# Patient Record
Sex: Female | Born: 1995 | Race: White | Hispanic: No | Marital: Married | Smoking: Never smoker
Health system: Southern US, Community
[De-identification: ages and names within clinical notes are randomized; demographics above are authoritative.]

## PROBLEM LIST (undated history)

## (undated) DIAGNOSIS — F32A Depression, unspecified: Secondary | ICD-10-CM

## (undated) DIAGNOSIS — F509 Eating disorder, unspecified: Secondary | ICD-10-CM

## (undated) DIAGNOSIS — F419 Anxiety disorder, unspecified: Secondary | ICD-10-CM

## (undated) HISTORY — PX: NO PAST SURGERIES: SHX2092

---

## 2019-09-23 ENCOUNTER — Other Ambulatory Visit: Payer: Self-pay

## 2019-09-23 ENCOUNTER — Encounter (HOSPITAL_COMMUNITY): Payer: Self-pay

## 2019-09-23 ENCOUNTER — Emergency Department (HOSPITAL_COMMUNITY)
Admission: EM | Admit: 2019-09-23 | Discharge: 2019-09-23 | Disposition: A | Discharge status: Home / Self Care | Payer: 59 | Attending: Emergency Medicine | Admitting: Emergency Medicine

## 2019-09-23 ENCOUNTER — Emergency Department (HOSPITAL_COMMUNITY): Payer: 59

## 2019-09-23 DIAGNOSIS — M79631 Pain in right forearm: Secondary | ICD-10-CM | POA: Diagnosis not present

## 2019-09-23 DIAGNOSIS — M25531 Pain in right wrist: Secondary | ICD-10-CM | POA: Diagnosis not present

## 2019-09-23 DIAGNOSIS — M25521 Pain in right elbow: Secondary | ICD-10-CM | POA: Diagnosis present

## 2019-09-23 DIAGNOSIS — W19XXXA Unspecified fall, initial encounter: Secondary | ICD-10-CM

## 2019-09-23 DIAGNOSIS — M545 Low back pain: Secondary | ICD-10-CM | POA: Diagnosis not present

## 2019-09-23 LAB — I-STAT BETA HCG BLOOD, ED (MC, WL, AP ONLY): I-stat hCG, quantitative: <5 m[IU]/mL (ref <5)

## 2019-09-23 MED ORDER — HYDROCODONE-ACETAMINOPHEN 5-325 MG PO TABS
1.0000 | ORAL_TABLET | Freq: Once | ORAL | Status: AC
  Administered 2019-09-23: 1 via ORAL
  Filled 2019-09-23: qty 1

## 2019-09-23 MED ORDER — IBUPROFEN 800 MG PO TABS: 800.0000 mg | ORAL_TABLET | Freq: Three times a day (TID) | ORAL | 0 refills | Status: DC | PRN

## 2019-09-23 MED ORDER — CYCLOBENZAPRINE HCL 10 MG PO TABS: 10.0000 mg | ORAL_TABLET | Freq: Every evening | ORAL | 0 refills | Status: DC | PRN

## 2019-09-23 NOTE — ED Triage Notes (Signed)
Pt presents with c/o lower back pain and right arm pain following a fall down 6-7 stairs. Pt states the fall was mechanical. Pt denies hitting her head/LOC. Pt A+Ox4, NAD.

## 2019-09-23 NOTE — Progress Notes (Signed)
Orthopedic Tech Progress Note Patient Details:  Heather Kent 07-11-1996 [id number]  Ortho Devices Type of Ortho Device: Arm sling Ortho Device/Splint Location: RUE Ortho Device/Splint Interventions: Ordered, Application   Post Interventions Patient Tolerated: Well Instructions Provided: Care of device   Braulio Bosch 09/23/2019, 9:23 PM

## 2019-09-23 NOTE — ED Provider Notes (Signed)
[facility] EMERGENCY DEPARTMENT Provider Note   CSN: [id number] Arrival date & time: 09/23/19  1634     History   Chief Complaint Chief Complaint  Patient presents with  . Fall  . Back Pain  . Arm Pain    HPI Heather Kent is a 23 y.o. female with no significant past medical history presents to the ED after sustaining mechanical fall down three steps of stairs.  She reports that she slipped and leaned backward, sliding down the stairs.  She reports pain and discomfort to her right elbow, forearm, wrist as well as lumbar spine.  She was able to ambulate without difficulty, but endorses pain in the low back.  She denies any head trauma, memory disturbance, nausea or vomiting, headache or dizziness, chest pain or difficulty breathing, visual changes, numbness, tingling, or other neurologic deficits.       HPI  History reviewed. No pertinent past medical history.  There are no active problems to display for this patient.   History reviewed. No pertinent surgical history.   OB History   No obstetric history on file.      Home Medications    Prior to Admission medications   Medication Sig Start Date End Date Taking? Authorizing Provider  cyclobenzaprine (FLEXERIL) 10 MG tablet Take 1 tablet (10 mg total) by mouth at bedtime as needed for muscle spasms. 09/23/19   Corena Herter, PA-C  ibuprofen (ADVIL) 800 MG tablet Take 1 tablet (800 mg total) by mouth 3 (three) times daily as needed for moderate pain. 09/23/19   Corena Herter, PA-C    Family History No family history on file.  Social History Social History   Tobacco Use  . Smoking status: Not on file  Substance Use Topics  . Alcohol use: Not on file  . Drug use: Not on file     Allergies   Patient has no known allergies.   Review of Systems Review of Systems  Constitutional: Negative for fever.  Respiratory: Negative for shortness of breath.   Cardiovascular: Negative for chest  pain.  Gastrointestinal: Negative for abdominal pain.  Neurological: Negative for dizziness and numbness.     Physical Exam Updated Vital Signs BP 114/65 (BP Location: Left Arm)   Pulse 77   Temp 98.6 F (37 C) (Oral)   Resp 18   LMP 08/28/2019 (LMP Unknown)   SpO2 99%   Physical Exam Vitals signs and nursing note reviewed. Exam conducted with a chaperone present.  Constitutional:      Appearance: Normal appearance.  HENT:     Head: Normocephalic and atraumatic.     Comments: No evidence of raccoon eyes, battle sign, hemotympanum, or other evidence of basilar skull fracture.  No palpable skull defect or overlying skin changes.    Mouth/Throat:     Pharynx: Oropharynx is clear.  Eyes:     General: No scleral icterus.    Conjunctiva/sclera: Conjunctivae normal.  Neck:     Musculoskeletal: Normal range of motion and neck supple. No neck rigidity or muscular tenderness.  Cardiovascular:     Rate and Rhythm: Normal rate and regular rhythm.     Pulses: Normal pulses.     Heart sounds: Normal heart sounds.  Pulmonary:     Effort: Pulmonary effort is normal. No respiratory distress.     Breath sounds: Normal breath sounds.  Abdominal:     General: Abdomen is flat. There is no distension.     Palpations: Abdomen is  soft.     Tenderness: There is no abdominal tenderness. There is no guarding.  Skin:    General: Skin is dry.  Neurological:     Mental Status: She is alert.     GCS: GCS eye subscore is 4. GCS verbal subscore is 5. GCS motor subscore is 6.  Psychiatric:        Mood and Affect: Mood normal.        Behavior: Behavior normal.        Thought Content: Thought content normal.      ED Treatments / Results  Labs (all labs ordered are listed, but only abnormal results are displayed) Labs Reviewed  I-STAT BETA HCG BLOOD, ED ([facility], [facility], [facility] ONLY)    EKG None  Radiology Dg Lumbar Spine Complete  Result Date: 09/23/2019 CLINICAL DATA:  Low back pain, fall EXAM:  LUMBAR SPINE - COMPLETE 4+ VIEW COMPARISON:  None. FINDINGS: There is no evidence of lumbar spine fracture. Alignment is normal. Intervertebral disc spaces are maintained. IMPRESSION: Negative. Electronically Signed   By: Charlett NoseKevin  Dover M.D.   On: 09/23/2019 20:22   Dg Forearm Right  Result Date: 09/23/2019 CLINICAL DATA:  Fall down steps.  Right arm pain EXAM: RIGHT FOREARM - 2 VIEW COMPARISON:  None. FINDINGS: There is no evidence of fracture or other focal bone lesions. Soft tissues are unremarkable. IMPRESSION: Negative. Electronically Signed   By: Charlett NoseKevin  Dover M.D.   On: 09/23/2019 20:22    Procedures Procedures (including critical care time)  Medications Ordered in ED Medications  HYDROcodone-acetaminophen (NORCO/VICODIN) 5-325 MG per tablet 1 tablet (1 tablet Oral Given 09/23/19 2043)     Initial Impression / Assessment and Plan / ED Course  I have reviewed the triage vital signs and the nursing notes.  Pertinent labs & imaging results that were available during my care of the patient were reviewed by me and considered in my medical decision making (see chart for details).        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  Patient is able to ambulate without difficulty, albeit with some discomfort.  No red flags concerning patient's back pain. No s/s of central cord compression or cauda equina. Lower extremities are neurovascularly intact and patient is ambulating without difficulty.  DG lumbar spine and DG forearm were interpreted and demonstrate no dislocation, subluxation, fracture, or other bony other abnormalities.  Patient's low back discomfort is predominantly on her right side where she claims she hit the steps.  No radicular symptoms at this time.  No saddle anesthesia or incontinence.  Her arm demonstrates full range of motion and is neurovascularly intact.  Radial, median, and ulnar nerves  were assessed with no abnormal findings.  Will provide sling for support and relief of her discomfort.  We will also prescribe a short course of muscle relaxers and refer her to orthopedics should she continue to experience any pain discomfort.  Also will encourage her to continue taking ibuprofen 800 mg 3 times daily as needed for pain and discomfort.   All of the evaluation and work-up results were discussed with the patient and any family at bedside. They were provided opportunity to ask any additional questions and have none at this time. They have expressed understanding of verbal discharge instructions as well as return precautions and are agreeable to the plan.     Final Clinical Impressions(s) / ED Diagnoses  Final diagnoses:  Fall, initial encounter    ED Discharge Orders         Ordered    cyclobenzaprine (FLEXERIL) 10 MG tablet  At bedtime PRN     09/23/19 2128    ibuprofen (ADVIL) 800 MG tablet  3 times daily PRN     09/23/19 2128           Elvera Maria 09/23/19 2128    Sabas Sous, MD 09/24/19 2303

## 2019-09-23 NOTE — ED Notes (Signed)
Patient verbalizes understanding of discharge instructions. Opportunity for questioning and answers were provided. Armband removed by staff, pt discharged from ED.  

## 2019-09-23 NOTE — Discharge Instructions (Signed)
You were given a prescription for Flexeril which is a muscle relaxer.  You should not drive, work, consume alcohol, or operate machinery while taking this medication as it can make you very drowsy.     Your back pain should be treated with medicines such as ibuprofen or aleve and this back pain should improve over the next 2 weeks.  However if you develop severe or worsening pain, low back pain with fever, numbness, weakness, or inability to walk or urinate, you should return to the ER immediately. You will need to follow up with your primary healthcare provider in 1-2 weeks for reassessment.  Low back pain is discomfort in the lower back that may be due to injuries to muscles and ligaments around the spine. Occasionally, it may be caused by a problem to a part of the spine called a disc.  The pain may last several days or a week, however most patients' pain is completely resolved in 4 weeks.  Self-care:  The application of heat can help soothe the pain.  Maintaining your daily activities, including walking, is encourged, as it will help you get better faster than staying in bed.  Medications are also useful to help with pain control.  A commonly prescribed medications includes acetaminophen.  This medication is generally safe, though you should not take more than 8 of the extra strength (500mg ) pills a day.  Non steroidal anti inflammatory medications including Ibuprofen and naproxen;  These medications help both pain and swelling and are very useful in treating back pain.  They should be taken with food, as they can cause stomach upset, and more seriously, stomach bleeding.    Muscle relaxants:  These medications can help with muscle tightness that is a cause of lower back pain.  Most of these medications can cause drowsiness, and it is not safe to drive or use dangerous machinery while taking them.  Be aware that if you develop new symptoms, such as a fever, leg weakness, difficulty with or loss of  control of your urine or bowels, abdominal pain, or more severe pain, you will need to seek medical attention and  / or return to the Emergency department.  If you do not have a doctor see the list below.

## 2020-04-22 LAB — OB RESULTS CONSOLE ABO/RH: RH Type: POSITIVE

## 2020-04-22 LAB — OB RESULTS CONSOLE VARICELLA ZOSTER ANTIBODY, IGG: Varicella: NON-IMMUNE/NOT IMMUNE

## 2020-04-22 LAB — OB RESULTS CONSOLE RPR: RPR: NONREACTIVE

## 2020-04-22 LAB — OB RESULTS CONSOLE HEPATITIS B SURFACE ANTIGEN: Hepatitis B Surface Ag: NEGATIVE

## 2020-04-22 LAB — OB RESULTS CONSOLE HIV ANTIBODY (ROUTINE TESTING): HIV: NONREACTIVE

## 2020-04-22 LAB — OB RESULTS CONSOLE GC/CHLAMYDIA
Chlamydia: NEGATIVE
Gonorrhea: NEGATIVE

## 2020-04-22 LAB — OB RESULTS CONSOLE RUBELLA ANTIBODY, IGM: Rubella: NON-IMMUNE/NOT IMMUNE

## 2020-10-21 LAB — OB RESULTS CONSOLE GBS: GBS: NEGATIVE

## 2020-10-29 ENCOUNTER — Telehealth (HOSPITAL_COMMUNITY): Payer: Self-pay | Admitting: *Deleted

## 2020-10-29 ENCOUNTER — Encounter (HOSPITAL_COMMUNITY): Payer: Self-pay | Admitting: *Deleted

## 2020-10-29 NOTE — Telephone Encounter (Signed)
Preadmission screen  

## 2020-10-30 ENCOUNTER — Telehealth (HOSPITAL_COMMUNITY): Payer: Self-pay | Admitting: *Deleted

## 2020-10-30 NOTE — Telephone Encounter (Signed)
Preadmission screen  

## 2020-11-03 ENCOUNTER — Telehealth (HOSPITAL_COMMUNITY): Payer: Self-pay | Admitting: *Deleted

## 2020-11-03 ENCOUNTER — Encounter (HOSPITAL_COMMUNITY): Payer: Self-pay | Admitting: *Deleted

## 2020-11-03 NOTE — Telephone Encounter (Signed)
Preadmission screen  

## 2020-11-08 NOTE — L&D Delivery Note (Signed)
DELIVERY NOTE  Pt complete and at +2 station with urge to push. Epidural controlling pain. Pt pushed and delivered a viable female infant in LOA position. Nuchal x2, removed. Anterior and posterior shoulders spontaneously delivered with next two pushes; body easily followed next. Infant placed on mothers abdomen and bulb suction of mouth and nose performed. Cord was then clamped and cut by FOB. Cord blood obtained, 3VC. Baby had a vigorous spontaneous cry noted. Placenta then delivered at 1631 intact. Fundal massage performed and pitocin per protocol. Fundus firm. The following lacerations were noted: BL labial and sulcal. Repaired in routine fashion with 3-o monocryl and 2-0 vicryl, respectively Mother and baby stable. Counts correct. EBL 400cc  Infant time: 65 Gender: female Placenta time: 1631 Apgars: 8/9 Weight: pending skin-to-skin

## 2020-11-10 ENCOUNTER — Other Ambulatory Visit: Payer: Self-pay | Admitting: Obstetrics and Gynecology

## 2020-11-10 ENCOUNTER — Other Ambulatory Visit (HOSPITAL_COMMUNITY)
Admission: RE | Admit: 2020-11-10 | Discharge: 2020-11-10 | Disposition: A | Discharge status: Home / Self Care | Payer: PRIVATE HEALTH INSURANCE | Source: Ambulatory Visit | Attending: Obstetrics and Gynecology | Admitting: Obstetrics and Gynecology

## 2020-11-10 DIAGNOSIS — Z20822 Contact with and (suspected) exposure to covid-19: Secondary | ICD-10-CM | POA: Insufficient documentation

## 2020-11-10 DIAGNOSIS — Z01818 Encounter for other preprocedural examination: Secondary | ICD-10-CM | POA: Insufficient documentation

## 2020-11-11 ENCOUNTER — Other Ambulatory Visit: Payer: Self-pay

## 2020-11-11 ENCOUNTER — Inpatient Hospital Stay (HOSPITAL_COMMUNITY): Payer: PRIVATE HEALTH INSURANCE | Admitting: Anesthesiology

## 2020-11-11 ENCOUNTER — Encounter (HOSPITAL_COMMUNITY): Payer: Self-pay | Admitting: Obstetrics and Gynecology

## 2020-11-11 ENCOUNTER — Inpatient Hospital Stay (HOSPITAL_COMMUNITY)
Admission: AD | Admit: 2020-11-11 | Discharge: 2020-11-13 | DRG: 807 | Disposition: A | Discharge status: Home / Self Care | Payer: PRIVATE HEALTH INSURANCE | Attending: Obstetrics and Gynecology | Admitting: Obstetrics and Gynecology

## 2020-11-11 ENCOUNTER — Inpatient Hospital Stay (HOSPITAL_COMMUNITY): Payer: PRIVATE HEALTH INSURANCE

## 2020-11-11 DIAGNOSIS — Z23 Encounter for immunization: Secondary | ICD-10-CM

## 2020-11-11 DIAGNOSIS — Z20822 Contact with and (suspected) exposure to covid-19: Secondary | ICD-10-CM | POA: Diagnosis present

## 2020-11-11 DIAGNOSIS — Z3A39 39 weeks gestation of pregnancy: Secondary | ICD-10-CM

## 2020-11-11 DIAGNOSIS — O26893 Other specified pregnancy related conditions, third trimester: Secondary | ICD-10-CM | POA: Diagnosis present

## 2020-11-11 HISTORY — DX: Anxiety disorder, unspecified: F41.9

## 2020-11-11 HISTORY — DX: Depression, unspecified: F32.A

## 2020-11-11 HISTORY — DX: Eating disorder, unspecified: F50.9

## 2020-11-11 LAB — CBC
HCT: 38 % (ref 36.0–46.0)
Hemoglobin: 13.4 g/dL (ref 12.0–15.0)
MCH: 31.8 pg (ref 26.0–34.0)
MCHC: 35.3 g/dL (ref 30.0–36.0)
MCV: 90 fL (ref 80.0–100.0)
Platelets: 230 10*3/uL (ref 150–400)
RBC: 4.22 MIL/uL (ref 3.87–5.11)
RDW: 12.9 % (ref 11.5–15.5)
WBC: 13 10*3/uL — ABNORMAL HIGH (ref 4.0–10.5)
nRBC: 0 % (ref 0.0–0.2)

## 2020-11-11 LAB — TYPE AND SCREEN
ABO/RH(D): A POS
Antibody Screen: NEGATIVE

## 2020-11-11 LAB — SARS CORONAVIRUS 2 (TAT 6-24 HRS): SARS Coronavirus 2: NEGATIVE

## 2020-11-11 LAB — RPR: RPR Ser Ql: NONREACTIVE

## 2020-11-11 MED ORDER — MISOPROSTOL 25 MCG QUARTER TABLET
25.0000 ug | ORAL_TABLET | ORAL | Status: DC | PRN
  Administered 2020-11-11 (×2): 25 ug via VAGINAL
  Filled 2020-11-11 (×2): qty 1

## 2020-11-11 MED ORDER — EPHEDRINE 5 MG/ML INJ: 10.0000 mg | INTRAVENOUS | Status: DC | PRN

## 2020-11-11 MED ORDER — SOD CITRATE-CITRIC ACID 500-334 MG/5ML PO SOLN: 30.0000 mL | ORAL | Status: DC | PRN

## 2020-11-11 MED ORDER — WITCH HAZEL-GLYCERIN EX PADS: 1.0000 "application " | MEDICATED_PAD | CUTANEOUS | Status: DC | PRN

## 2020-11-11 MED ORDER — LACTATED RINGERS IV SOLN: 500.0000 mL | INTRAVENOUS | Status: DC | PRN

## 2020-11-11 MED ORDER — BENZOCAINE-MENTHOL 20-0.5 % EX AERO: 1.0000 "application " | INHALATION_SPRAY | CUTANEOUS | Status: DC | PRN

## 2020-11-11 MED ORDER — OXYTOCIN-SODIUM CHLORIDE 30-0.9 UT/500ML-% IV SOLN: 1.0000 m[IU]/min | INTRAVENOUS | Status: DC

## 2020-11-11 MED ORDER — TETANUS-DIPHTH-ACELL PERTUSSIS 5-2.5-18.5 LF-MCG/0.5 IM SUSY: 0.5000 mL | PREFILLED_SYRINGE | Freq: Once | INTRAMUSCULAR | Status: DC

## 2020-11-11 MED ORDER — SENNOSIDES-DOCUSATE SODIUM 8.6-50 MG PO TABS
2.0000 | ORAL_TABLET | Freq: Every day | ORAL | Status: DC
  Filled 2020-11-11: qty 2

## 2020-11-11 MED ORDER — OXYCODONE-ACETAMINOPHEN 5-325 MG PO TABS
1.0000 | ORAL_TABLET | ORAL | Status: DC | PRN
Start: 2020-11-11 — End: 2020-11-11

## 2020-11-11 MED ORDER — COCONUT OIL OIL
1.0000 "application " | TOPICAL_OIL | Status: DC | PRN
  Administered 2020-11-13: 1 via TOPICAL

## 2020-11-11 MED ORDER — TERBUTALINE SULFATE 1 MG/ML IJ SOLN: 0.2500 mg | Freq: Once | INTRAMUSCULAR | Status: DC | PRN

## 2020-11-11 MED ORDER — DIPHENHYDRAMINE HCL 25 MG PO CAPS
25.0000 mg | ORAL_CAPSULE | Freq: Four times a day (QID) | ORAL | Status: DC | PRN
  Administered 2020-11-12: 25 mg via ORAL
  Filled 2020-11-11: qty 1

## 2020-11-11 MED ORDER — PHENYLEPHRINE 40 MCG/ML (10ML) SYRINGE FOR IV PUSH (FOR BLOOD PRESSURE SUPPORT): 80.0000 ug | PREFILLED_SYRINGE | INTRAVENOUS | Status: DC | PRN

## 2020-11-11 MED ORDER — DIBUCAINE (PERIANAL) 1 % EX OINT: 1.0000 "application " | TOPICAL_OINTMENT | CUTANEOUS | Status: DC | PRN

## 2020-11-11 MED ORDER — LIDOCAINE HCL (PF) 1 % IJ SOLN: 30.0000 mL | INTRAMUSCULAR | Status: DC | PRN

## 2020-11-11 MED ORDER — LIDOCAINE-EPINEPHRINE (PF) 2 %-1:200000 IJ SOLN
INTRAMUSCULAR | Status: DC | PRN
  Administered 2020-11-11: 5 mL via EPIDURAL

## 2020-11-11 MED ORDER — ACETAMINOPHEN 325 MG PO TABS
650.0000 mg | ORAL_TABLET | ORAL | Status: DC | PRN
  Administered 2020-11-12: 650 mg via ORAL
  Filled 2020-11-11: qty 2

## 2020-11-11 MED ORDER — OXYTOCIN BOLUS FROM INFUSION
333.0000 mL | Freq: Once | INTRAVENOUS | Status: AC
  Administered 2020-11-11: 333 mL via INTRAVENOUS

## 2020-11-11 MED ORDER — SIMETHICONE 80 MG PO CHEW: 80.0000 mg | CHEWABLE_TABLET | ORAL | Status: DC | PRN

## 2020-11-11 MED ORDER — IBUPROFEN 600 MG PO TABS
600.0000 mg | ORAL_TABLET | Freq: Four times a day (QID) | ORAL | Status: DC
  Administered 2020-11-11 – 2020-11-13 (×6): 600 mg via ORAL
  Filled 2020-11-11 (×6): qty 1

## 2020-11-11 MED ORDER — LACTATED RINGERS IV SOLN
500.0000 mL | Freq: Once | INTRAVENOUS | Status: AC
  Administered 2020-11-11: 500 mL via INTRAVENOUS

## 2020-11-11 MED ORDER — ONDANSETRON HCL 4 MG/2ML IJ SOLN
4.0000 mg | Freq: Four times a day (QID) | INTRAMUSCULAR | Status: DC | PRN
  Administered 2020-11-11: 4 mg via INTRAVENOUS
  Filled 2020-11-11: qty 2

## 2020-11-11 MED ORDER — BUTORPHANOL TARTRATE 1 MG/ML IJ SOLN
1.0000 mg | INTRAMUSCULAR | Status: DC | PRN
  Administered 2020-11-11: 1 mg via INTRAVENOUS
  Filled 2020-11-11: qty 1

## 2020-11-11 MED ORDER — FENTANYL-BUPIVACAINE-NACL 0.5-0.125-0.9 MG/250ML-% EP SOLN: 12.0000 mL/h | EPIDURAL | Status: DC | PRN

## 2020-11-11 MED ORDER — ONDANSETRON HCL 4 MG/2ML IJ SOLN: 4.0000 mg | INTRAMUSCULAR | Status: DC | PRN

## 2020-11-11 MED ORDER — OXYCODONE-ACETAMINOPHEN 5-325 MG PO TABS: 2.0000 | ORAL_TABLET | ORAL | Status: DC | PRN

## 2020-11-11 MED ORDER — LACTATED RINGERS IV SOLN: 500.0000 mL | Freq: Once | INTRAVENOUS | Status: DC

## 2020-11-11 MED ORDER — PRENATAL MULTIVITAMIN CH
1.0000 | ORAL_TABLET | Freq: Every day | ORAL | Status: DC
  Filled 2020-11-11: qty 1

## 2020-11-11 MED ORDER — OXYTOCIN-SODIUM CHLORIDE 30-0.9 UT/500ML-% IV SOLN
1.0000 m[IU]/min | INTRAVENOUS | Status: DC
  Administered 2020-11-11: 2 m[IU]/min via INTRAVENOUS
  Filled 2020-11-11: qty 500

## 2020-11-11 MED ORDER — SODIUM CHLORIDE (PF) 0.9 % IJ SOLN
INTRAMUSCULAR | Status: DC | PRN
  Administered 2020-11-11: 12 mL/h via EPIDURAL

## 2020-11-11 MED ORDER — OXYTOCIN-SODIUM CHLORIDE 30-0.9 UT/500ML-% IV SOLN
2.5000 [IU]/h | INTRAVENOUS | Status: DC
  Administered 2020-11-11: 2.5 [IU]/h via INTRAVENOUS

## 2020-11-11 MED ORDER — DIPHENHYDRAMINE HCL 50 MG/ML IJ SOLN: 12.5000 mg | INTRAMUSCULAR | Status: DC | PRN

## 2020-11-11 MED ORDER — FLEET ENEMA 7-19 GM/118ML RE ENEM: 1.0000 | ENEMA | RECTAL | Status: DC | PRN

## 2020-11-11 MED ORDER — ACETAMINOPHEN 325 MG PO TABS
650.0000 mg | ORAL_TABLET | ORAL | Status: DC | PRN
  Administered 2020-11-11: 650 mg via ORAL
  Filled 2020-11-11: qty 2

## 2020-11-11 MED ORDER — ZOLPIDEM TARTRATE 5 MG PO TABS: 5.0000 mg | ORAL_TABLET | Freq: Every evening | ORAL | Status: DC | PRN

## 2020-11-11 MED ORDER — LACTATED RINGERS IV SOLN: INTRAVENOUS | Status: DC

## 2020-11-11 MED ORDER — FENTANYL-BUPIVACAINE-NACL 0.5-0.125-0.9 MG/250ML-% EP SOLN
12.0000 mL/h | EPIDURAL | Status: DC | PRN
  Filled 2020-11-11: qty 250

## 2020-11-11 MED ORDER — ONDANSETRON HCL 4 MG PO TABS: 4.0000 mg | ORAL_TABLET | ORAL | Status: DC | PRN

## 2020-11-11 NOTE — Plan of Care (Signed)
  Problem: Education: Goal: Knowledge of Childbirth will improve Outcome: Progressing Goal: Ability to make informed decisions regarding treatment and plan of care will improve Outcome: Progressing Goal: Ability to state and carry out methods to decrease the pain will improve Outcome: Progressing   Problem: Coping: Goal: Ability to verbalize concerns and feelings about labor and delivery will improve Outcome: Progressing   

## 2020-11-11 NOTE — Lactation Note (Signed)
This note was copied from a baby's chart. Lactation Consultation Note  Patient Name: Heather Kent Date: 11/11/2020 Reason for consult: Initial assessment;Difficult latch;Mother's request;1st time breastfeeding;Primapara;Term Age:24 hours  Infant is working on latching. Mom's breasts are short shafted, soft and compressible. Breast shells provided to help bring her nipples out.   LC able to demonstrate hand expression. 2 ml provided to infant with finger feeding.   Mom has Spectra 2 and Willow pump at home. Mom had breast changes and colostrum leakage prior to delivery.   Plan 1. To feed based on cues 8-12 x in 24 hours no more than 4 hours without an attempt.          2. Mom to offer both breasts and look for milk transfer.          3 I and O sheet reviewed          4 LC brochure reviewed of inpatient and outpatient services.   Infant still showing cues for feeding. Parents are do hand expression and finger feeding at the end of the visit.   Parents will call for assistance with latch on the next feeding.

## 2020-11-11 NOTE — Anesthesia Preprocedure Evaluation (Addendum)
Anesthesia Evaluation  Patient identified by MRN, date of birth, ID band Patient awake    Reviewed: Allergy & Precautions, Patient's Chart, lab work & pertinent test results  Airway Mallampati: II       Dental   Pulmonary    Pulmonary exam normal        Cardiovascular negative cardio ROS Normal cardiovascular exam     Neuro/Psych PSYCHIATRIC DISORDERS Anxiety Depression    GI/Hepatic   Endo/Other  negative endocrine ROS  Renal/GU negative Renal ROS     Musculoskeletal   Abdominal   Peds  Hematology negative hematology ROS (+)   Anesthesia Other Findings   Reproductive/Obstetrics (+) Pregnancy                            Anesthesia Physical Anesthesia Plan  ASA: II  Anesthesia Plan: Epidural   Post-op Pain Management:    Induction:   PONV Risk Score and Plan: 0  Airway Management Planned: Natural Airway and Simple Face Mask  Additional Equipment: None  Intra-op Plan:   Post-operative Plan:   Informed Consent: I have reviewed the patients History and Physical, chart, labs and discussed the procedure including the risks, benefits and alternatives for the proposed anesthesia with the patient or authorized representative who has indicated his/her understanding and acceptance.       Plan Discussed with:   Anesthesia Plan Comments: (Lab Results      Component                Value               Date                      WBC                      13.0 (H)            11/11/2020                HGB                      13.4                11/11/2020                HCT                      38.0                11/11/2020                MCV                      90.0                11/11/2020                PLT                      230                 11/11/2020           )       Anesthesia Quick Evaluation

## 2020-11-11 NOTE — Progress Notes (Signed)
Labor Note  S: s/p epidural, comfortable  O: BP 117/69   Pulse 67   Temp 97.6 F (36.4 C) (Oral)   Resp 17   Ht 5\' 9"  (1.753 m)   Wt 92.5 kg   BMI 30.13 kg/m  CE: 4/90/-1 FHR: Baseline 140, -accels, occ early decels, min to modvariability TOCO q2-5, pitocin at 10mU/min  A/P: This is a 25 y.o. G1P0 at [redacted]w[redacted]d  admitted for IOL at term, baby girl FWB: cat 1 tracing MWB: s/p epidural Labor course: s/p clear AROM @ 0800, continue to titrate per protocol  Anticipate SVD

## 2020-11-11 NOTE — Lactation Note (Signed)
Lactation Consultation Note  Patient Name: Heather Kent [id number] Date: 11/11/2020   Age:25 y.o. [facility] contact RN Hurshel Party, RN to let [facility] know if mom wants to be seen in L and D.      Zykeria Laguardia S Hannie Shoe 11/11/2020, 4:31 PM

## 2020-11-11 NOTE — Anesthesia Procedure Notes (Signed)
Epidural Patient location during procedure: OB Start time: 11/11/2020 9:23 AM End time: 11/11/2020 9:29 AM  Staffing Anesthesiologist: Shelton Silvas, MD Performed: anesthesiologist   Preanesthetic Checklist Completed: patient identified, IV checked, site marked, risks and benefits discussed, surgical consent, monitors and equipment checked, pre-op evaluation and timeout performed  Epidural Patient position: sitting Prep: DuraPrep Patient monitoring: heart rate, continuous pulse ox and blood pressure Approach: midline Location: L3-L4 Injection technique: LOR saline  Needle:  Needle type: Tuohy  Needle gauge: 17 G Needle length: 9 cm Catheter type: closed end flexible Catheter size: 20 Guage Test dose: negative and 1.5% lidocaine  Assessment Events: blood not aspirated, injection not painful, no injection resistance and no paresthesia  Additional Notes LOR @ 5  Patient identified. Risks/Benefits/Options discussed with patient including but not limited to bleeding, infection, nerve damage, paralysis, failed block, incomplete pain control, headache, blood pressure changes, nausea, vomiting, reactions to medications, itching and postpartum back pain. Confirmed with bedside nurse the patient's most recent platelet count. Confirmed with patient that they are not currently taking any anticoagulation, have any bleeding history or any family history of bleeding disorders. Patient expressed understanding and wished to proceed. All questions were answered. Sterile technique was used throughout the entire procedure. Please see nursing notes for vital signs. Test dose was given through epidural catheter and negative prior to continuing to dose epidural or start infusion. Warning signs of high block given to the patient including shortness of breath, tingling/numbness in hands, complete motor block, or any concerning symptoms with instructions to call for help. Patient was given instructions on fall  risk and not to get out of bed. All questions and concerns addressed with instructions to call with any issues or inadequate analgesia.    Reason for block:procedure for pain

## 2020-11-11 NOTE — H&P (Addendum)
Heather Kent is a 25 y.o. female presenting for scheduled IOL. +FM, denies VB, LOF, occ pelvic pressure  PNC essentially uncomplicated, is both RNI and VZNI OB History    Gravida  1   Para      Term      Preterm      AB      Living        SAB      IAB      Ectopic      Multiple      Live Births             Past Medical History:  Diagnosis Date  . Anxiety   . Depression   . Eating disorder    as a teen   Past Surgical History:  Procedure Laterality Date  . NO PAST SURGERIES     Family History: family history is not on file. Social History:  reports that she has never smoked. She has never used smokeless tobacco. She reports previous alcohol use. She reports that she does not use drugs.     Maternal Diabetes: No 1hr 73 Genetic Screening: Normal Maternal Ultrasounds/Referrals: Normal Fetal Ultrasounds or other Referrals:  None Maternal Substance Abuse:  No Significant Maternal Medications:  None Significant Maternal Lab Results:  Group B Strep negative Other Comments:  None  Review of Systems  Constitutional: Negative for chills and fever.  Respiratory: Negative for shortness of breath.   Cardiovascular: Negative for chest pain, palpitations and leg swelling.  Gastrointestinal: Negative for abdominal pain and vomiting.  Neurological: Negative for dizziness, weakness and headaches.  Psychiatric/Behavioral: Negative for suicidal ideas.   Maternal Medical History:  Prenatal complications: No bleeding, PIH, IUGR, placental abnormality, preterm labor or substance abuse.   Prenatal Complications - Diabetes: none.    Dilation: 2.5 Effacement (%): 50 Station: -2 Exam by:: Dr. Reina Kent Blood pressure 114/62, pulse (!) 58, temperature 97.8 F (36.6 C), temperature source Oral, resp. rate 17, height 5\' 9"  (1.753 m), weight 92.5 kg. Exam Physical Exam Constitutional:      General: She is not in acute distress.    Appearance: She is well-developed and  well-nourished.  HENT:     Head: Normocephalic and atraumatic.  Eyes:     Pupils: Pupils are equal, round, and reactive to light.  Cardiovascular:     Rate and Rhythm: Normal rate and regular rhythm.     Heart sounds: No murmur heard. No gallop.   Abdominal:     Tenderness: There is no abdominal tenderness. There is no guarding or rebound.  Genitourinary:    Vagina: Normal.     Uterus: Normal.   Musculoskeletal:        General: Normal range of motion.     Cervical back: Normal range of motion and neck supple.  Skin:    General: Skin is warm and dry.  Neurological:     Mental Status: She is alert and oriented to person, place, and time.     Prenatal labs: ABO, Rh: --/--/A POS (01/04 0045) Antibody: NEG (01/04 0045) Rubella: Nonimmune (06/15 0000) RPR: Nonreactive (06/15 0000)  HBsAg: Negative (06/15 0000)  HIV: Non-reactive (06/15 0000)  GBS: Negative/-- (12/14 0000)   Cat 1 tracing, + accels, - decels, mod var ToCO q3-32m s/p AROM  Assessment/Plan: This is a 24yo G1P0@  39 3/7 by LMP c/w 10 3/7 scan admitted for IOL for favorable cervix at term. GBS neg, RNI, VZNI. Baby girl  S/p PV cytotec x1,  CE 2-3/50/-2, s/p clear AROM, will initiate pitocin per protocol when amenable. Desires epidural, anticipate SVD   Heather Kent 11/11/2020, 8:27 AM

## 2020-11-12 LAB — CBC
HCT: 33 % — ABNORMAL LOW (ref 36.0–46.0)
Hemoglobin: 11.5 g/dL — ABNORMAL LOW (ref 12.0–15.0)
MCH: 31.9 pg (ref 26.0–34.0)
MCHC: 34.8 g/dL (ref 30.0–36.0)
MCV: 91.7 fL (ref 80.0–100.0)
Platelets: 187 10*3/uL (ref 150–400)
RBC: 3.6 MIL/uL — ABNORMAL LOW (ref 3.87–5.11)
RDW: 13.1 % (ref 11.5–15.5)
WBC: 14 10*3/uL — ABNORMAL HIGH (ref 4.0–10.5)
nRBC: 0 % (ref 0.0–0.2)

## 2020-11-12 MED ORDER — HYDROCORTISONE 1 % EX CREA
TOPICAL_CREAM | Freq: Three times a day (TID) | CUTANEOUS | Status: DC
  Filled 2020-11-12: qty 28

## 2020-11-12 MED ORDER — HYDROXYZINE HCL 25 MG PO TABS
25.0000 mg | ORAL_TABLET | Freq: Three times a day (TID) | ORAL | Status: DC | PRN
  Administered 2020-11-12: 25 mg via ORAL
  Filled 2020-11-12: qty 1

## 2020-11-12 NOTE — Anesthesia Postprocedure Evaluation (Signed)
Anesthesia Post Note  Patient: Heather Kent  Procedure(s) Performed: AN AD HOC LABOR EPIDURAL     Patient location during evaluation: Mother Baby Anesthesia Type: Epidural Level of consciousness: awake Pain management: satisfactory to patient Vital Signs Assessment: post-procedure vital signs reviewed and stable Respiratory status: spontaneous breathing Cardiovascular status: stable Anesthetic complications: no   No complications documented.  Last Vitals:  Vitals:   11/12/20 0421 11/12/20 0805  BP: 111/74 97/66  Pulse: 62 (!) 57  Resp: 18 17  Temp: 36.6 C 36.6 C  SpO2:      Last Pain:  Vitals:   11/12/20 0805  TempSrc: Oral  PainSc:    Pain Goal: Patients Stated Pain Goal: 7 (11/11/20 0850)                 Cephus Shelling

## 2020-11-12 NOTE — Social Work (Signed)
MOB was referred for history of eating disorder, depression and anxiety.   * Referral screened out by Clinical Social Worker because none of the following criteria appear to apply:  ~ History of anxiety/depression during this pregnancy, or of post-partum depression following prior delivery. ~ Diagnosis of anxiety and/or depression within last 3 years. CSW reviewed chart, MOB reported diagnosis at age 25. OR * MOB's symptoms currently being treated with medication and/or therapy.  Please contact the Clinical Social Worker if needs arise, by MOB request, or if MOB scores greater than 9/yes to question 10 on Edinburgh Postpartum Depression Screen.  Lorin Hauck, LCSWA Clinical Social Work [facility]  [phone] 

## 2020-11-12 NOTE — Progress Notes (Addendum)
Patient ID: Heather Kent, female   DOB: [birth year], 25 y.o.   MRN: [id number] Pt doing well. She is ambulating, voiding, tolerating diet well. She denies HA, CP, SOB and pain well controlled with medications. Lochia is mild. She admits has not slept much. Bonding well with daughter. Breastfeeding . She reports new onset of fine rash on abdomen - itchy VSS GEN - NAD ABD - FF and 2cm below umbilicus , dry rash, scattered over lower and mid abdomen EXT - no homans   14>33<187  A/P: PPD#1 s/p svd - stable         - Routine pp care        - hydrocortisone and vistaril for rash        - Likely discharge to home tomorrow

## 2020-11-12 NOTE — Lactation Note (Signed)
This note was copied from a baby's chart. Lactation Consultation Note  Patient Name: Heather Kent [id number] Date: 11/12/2020 Reason for consult: Follow-up assessment Age:24 hours  LC entered room mom was holding infant in bed, STS. Dad and grandmother present in room. Parents understand infant may start cluster feeding after 24 hours of life and this is normal behavior. Mom will continue to BF infant according to cues, 8 to 12+ times within 24 hours. Per dad, infant had 4 stools and 2 voids at 23 hours of life. Mom feels BF is improving infant is latching longer at the breast, recently finished BF for 15 minutes and then another 5 minutes less within one hour. LC did not observe latch, infant had finish BF prior to [name] entering the room. Mom did not have any questions or concerns for LC at this time. Mom knows to do breast compressions, gently stroking infant's neck and shoulder to keep infant awake while BF and increasing BF duration.  Maternal Data    Feeding    LATCH Score                   Interventions Interventions: Skin to skin;Breast compression;Hand express  Lactation Tools Discussed/Used     Consult Status Consult Status: Follow-up Date: 11/13/20 Follow-up type: In-patient    Danelle Earthly 11/12/2020, 3:43 PM

## 2020-11-13 MED ORDER — MEASLES, MUMPS & RUBELLA VAC IJ SOLR
0.5000 mL | Freq: Once | INTRAMUSCULAR | Status: AC
  Administered 2020-11-13: 0.5 mL via SUBCUTANEOUS
  Filled 2020-11-13: qty 0.5

## 2020-11-13 MED ORDER — IBUPROFEN 600 MG PO TABS: 600.0000 mg | ORAL_TABLET | Freq: Four times a day (QID) | ORAL | 0 refills | Status: AC

## 2020-11-13 MED ORDER — ACETAMINOPHEN 325 MG PO TABS: 650.0000 mg | ORAL_TABLET | ORAL | 0 refills | Status: AC | PRN

## 2020-11-13 NOTE — Lactation Note (Signed)
This note was copied from a baby's chart. Lactation Consultation Note  Patient Name: Heather Kent IWOEH'O Date: 11/13/2020 Reason for consult: Follow-up assessment;Primapara;1st time breastfeeding;Term;Infant weight loss;Other (Comment);Nipple pain/trauma (7 % weight loss) Age:24 hours  Baby for D/C with mom.  Per mom the baby recently fed at 11 am and breast feeding is improving , baby now latches both breast .  LC reviewed updated the doc flow sheets per mom/ WNL age.  Mom showed LC the bruising she has on the areola.  LC reviewed sore nipple and engorgement prevention and tx .  Per mom has a hand pump and a DEBP .  LC provided comfort gels alternating with shells.  LC provided the [facility] brochure with resource phone numbers.   Maternal Data    Feeding Feeding Type:  (per mom the baby last fed at 1100 am)  LATCH Score                   Interventions Interventions: Breast feeding basics reviewed;Comfort gels;Shells  Lactation Tools Discussed/Used Pump Education: Milk Storage   Consult Status Consult Status: Complete Date: 11/13/20    Kathrin Greathouse 11/13/2020, 12:28 PM

## 2020-11-13 NOTE — Progress Notes (Signed)
Post Partum Day 2 Subjective: no complaints, up ad lib and tolerating PO  Objective: Blood pressure 115/68, pulse 66, temperature 98.3 F (36.8 C), temperature source Oral, resp. rate 16, height 5\' 9"  (1.753 m), weight 92.5 kg, SpO2 98 %, unknown if currently breastfeeding.  Physical Exam:  General: alert and cooperative Lochia: appropriate Uterine Fundus: firm  Recent Labs    11/11/20 0046 11/12/20 0733  HGB 13.4 11.5*  HCT 38.0 33.0*    Assessment/Plan: Discharge home   LOS: 2 days   01/10/21 11/13/2020, 9:27 AM

## 2020-11-13 NOTE — Discharge Summary (Signed)
Postpartum Discharge Summary      Patient Name: Heather Kent DOB: Oct 28, 1996 MRN: [id number]  Date of admission: 11/11/2020 Delivery date:11/11/2020  Delivering provider: [name]  Date of discharge: 11/13/2020  Admitting diagnosis: [redacted] weeks gestation of pregnancy [Z3A.39] Intrauterine pregnancy: [redacted]w[redacted]d     Secondary diagnosis:  Active Problems:   [redacted] weeks gestation of pregnancy  Additional problems: none    Discharge diagnosis: Term Pregnancy Delivered                                              Post partum procedures:none Augmentation: AROM, Pitocin and Cytotec Complications: None  Hospital course: Induction of Labor With Vaginal Delivery   25 y.o. yo G1P1001 at [redacted]w[redacted]d was admitted to the hospital 11/11/2020 for induction of labor.  Indication for induction: Favorable cervix at term.  Patient had an uncomplicated labor course as follows: Membrane Rupture Time/Date: 8:08 AM ,11/11/2020   Delivery Method:Vaginal, Spontaneous  Episiotomy: None  Lacerations:  Labial;Sulcus  Details of delivery can be found in separate delivery note.  Patient had a routine postpartum course. Patient is discharged home 11/13/20.  Newborn Data: Birth date:11/11/2020  Birth time:4:27 PM  Gender:Female  Living status:Living  Apgars:8 ,9  Weight:3050 g   Magnesium Sulfate received: No BMZ received: No Rhophylac:No   Physical exam  Vitals:   11/12/20 0805 11/12/20 1402 11/12/20 2212 11/13/20 0554  BP: 97/66 107/61 (!) 109/53 115/68  Pulse: (!) 57 78 (!) 58 66  Resp: 17 16 16 16   Temp: 97.8 F (36.6 C) 98.9 F (37.2 C) 98.1 F (36.7 C) 98.3 F (36.8 C)  TempSrc: Oral Oral Oral Oral  SpO2:  100% 98% 98%  Weight:      Height:       General: alert and cooperative Lochia: appropriate Uterine Fundus: firm  Labs: Lab Results  Component Value Date   WBC 14.0 (H) 11/12/2020   HGB 11.5 (L) 11/12/2020   HCT 33.0 (L) 11/12/2020   MCV 91.7 11/12/2020   PLT 187 11/12/2020   No  flowsheet data found. Edinburgh Score: Edinburgh Postnatal Depression Scale Screening Tool 11/12/2020  I have been able to laugh and see the funny side of things. 0  I have looked forward with enjoyment to things. 0  I have blamed myself unnecessarily when things went wrong. 1  I have been anxious or worried for no good reason. 2  I have felt scared or panicky for no good reason. 1  Things have been getting on top of me. 1  I have been so unhappy that I have had difficulty sleeping. 0  I have felt sad or miserable. 0  I have been so unhappy that I have been crying. 0  The thought of harming myself has occurred to me. 0  Edinburgh Postnatal Depression Scale Total 5     After visit meds:  Allergies as of 11/13/2020   No Known Allergies     Medication List    STOP taking these medications   cyclobenzaprine 10 MG tablet Commonly known as: FLEXERIL     TAKE these medications   acetaminophen 325 MG tablet Commonly known as: Tylenol Take 2 tablets (650 mg total) by mouth every 4 (four) hours as needed (for pain scale < 4).   ibuprofen 600 MG tablet Commonly known as: ADVIL Take 1 tablet (600  mg total) by mouth every 6 (six) hours. What changed:   medication strength  how much to take  when to take this  reasons to take this        Discharge home in stable condition Infant Feeding: Breast Infant Disposition:home with mother Discharge instruction: per After Visit Summary and Postpartum booklet. Activity: Advance as tolerated. Pelvic rest for 6 weeks.  Diet: routine diet Future Appointments:No future appointments. Follow up Visit:  Follow-up Information    Shivaji, Valerie Roys, MD. Schedule an appointment as soon as possible for a visit in 6 week(s).   Specialty: Obstetrics and Gynecology Why: postpartum Contact information: [address] [place] [address] [place] [address] [phone]                Please schedule this patient for a In person postpartum  visit in 6 weeks with the following provider: MD.  Delivery mode:  Vaginal, Spontaneous  Anticipated Birth Control:  POPs   11/13/2020 Oliver Pila, MD

## 2021-02-11 IMAGING — CR DG LUMBAR SPINE COMPLETE 4+V
5 series · 6 of 6 positions shown · non-contrast
Comparison: None.

CLINICAL DATA: Low back pain, fall

EXAM:
LUMBAR SPINE - COMPLETE 4+ VIEW

[l-spine ap]
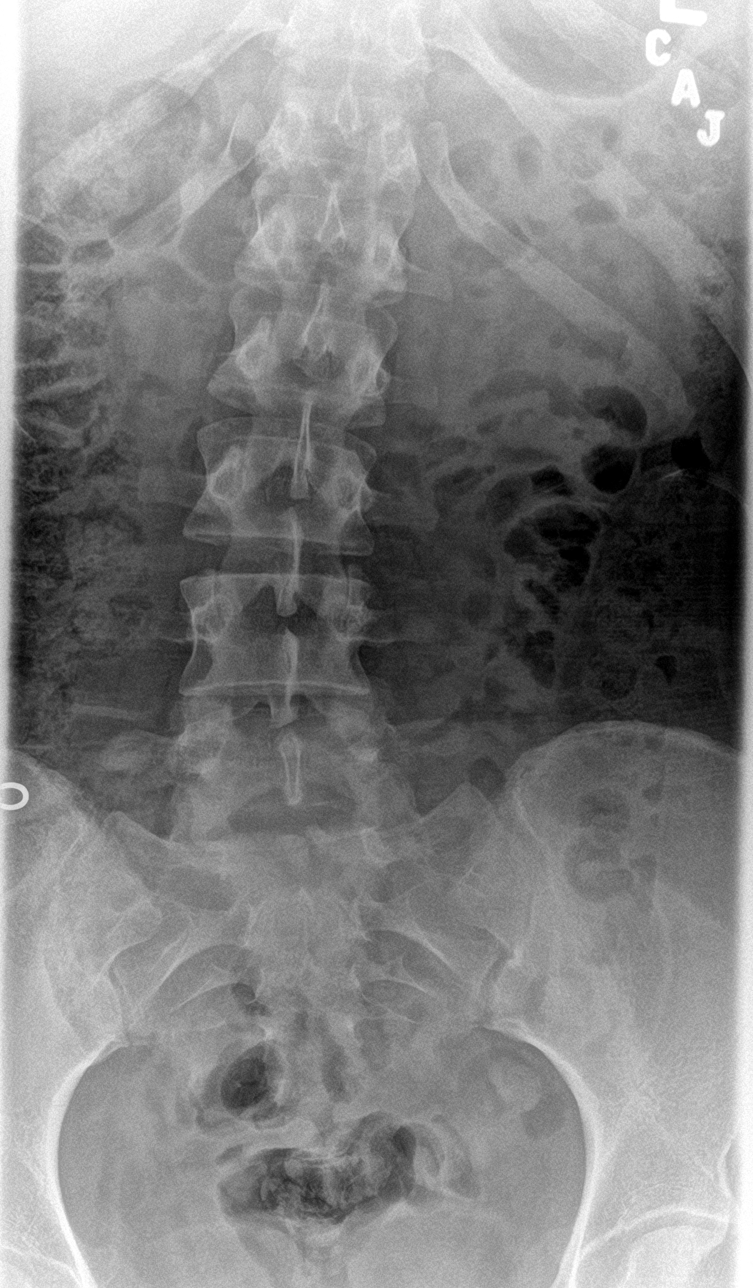

[l-spine obl (1 of 2)]
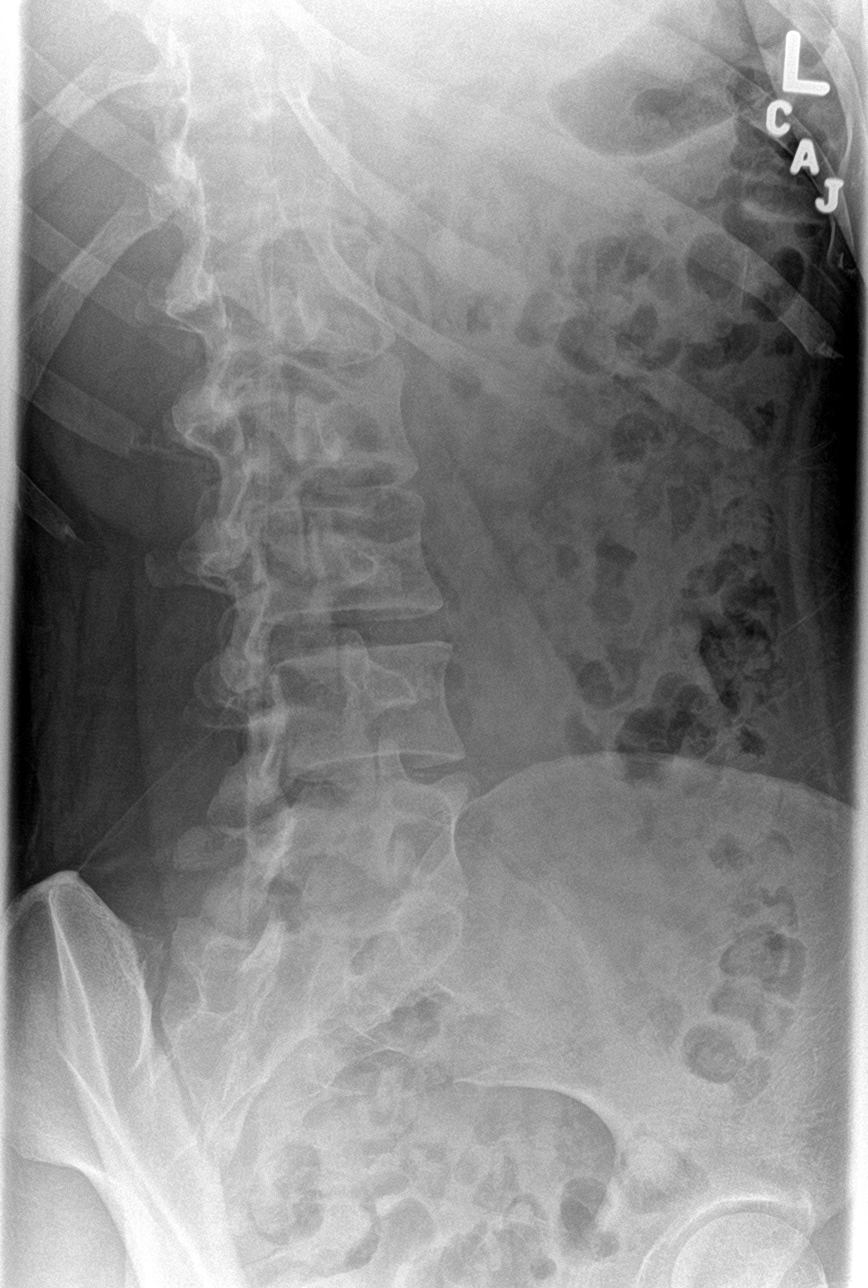

[Series 4: l-spine lat · 0.14mm/px · 2 of 2 slices shown]
[im 1/2]
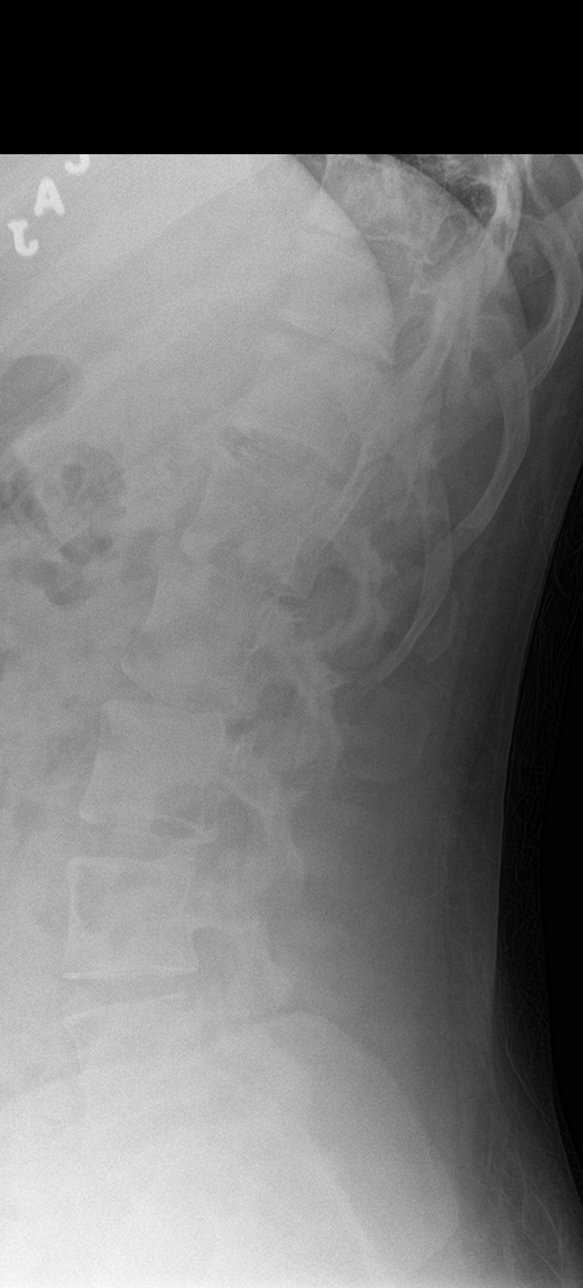
[im 2/2]
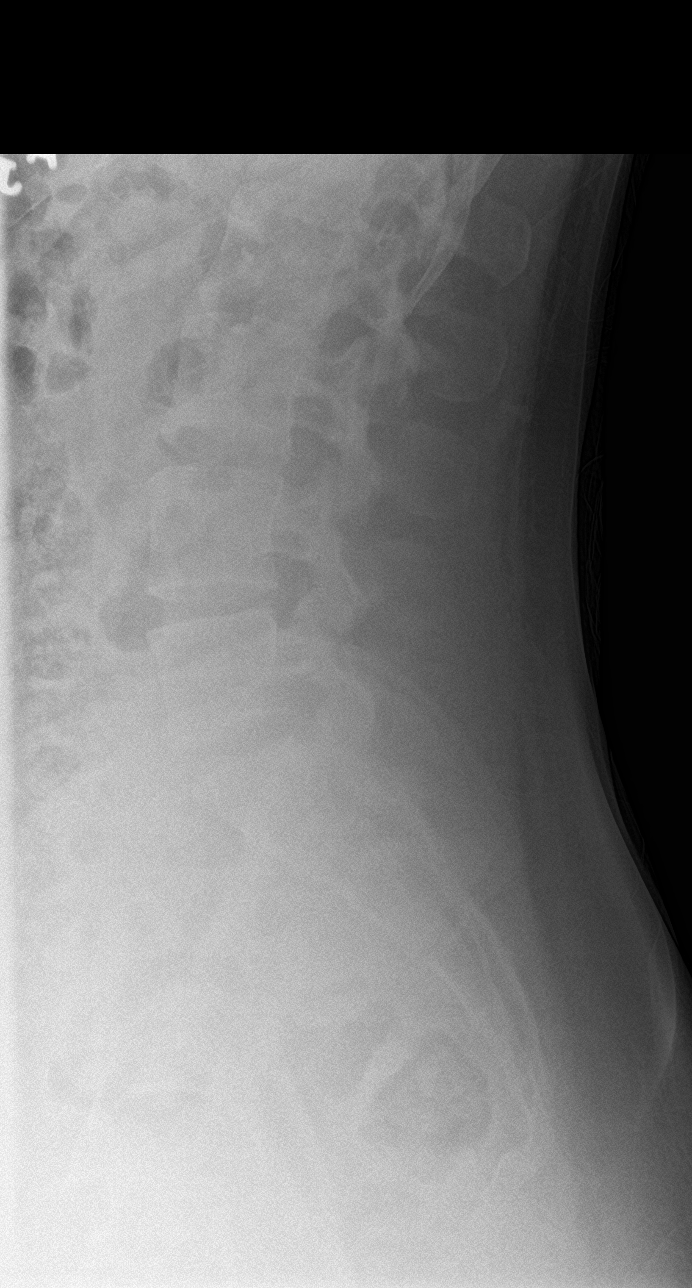

[l-spine spot]
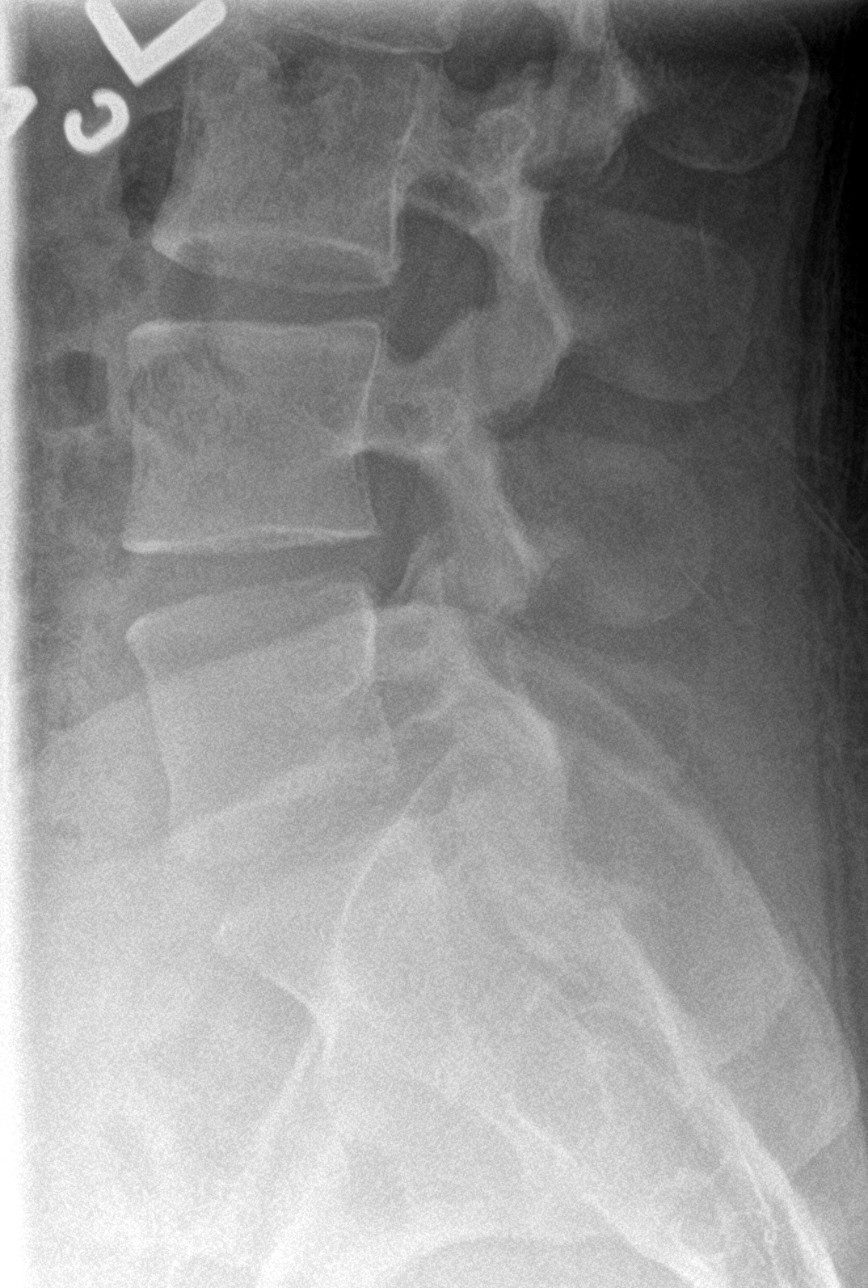

[l-spine obl (2 of 2)]
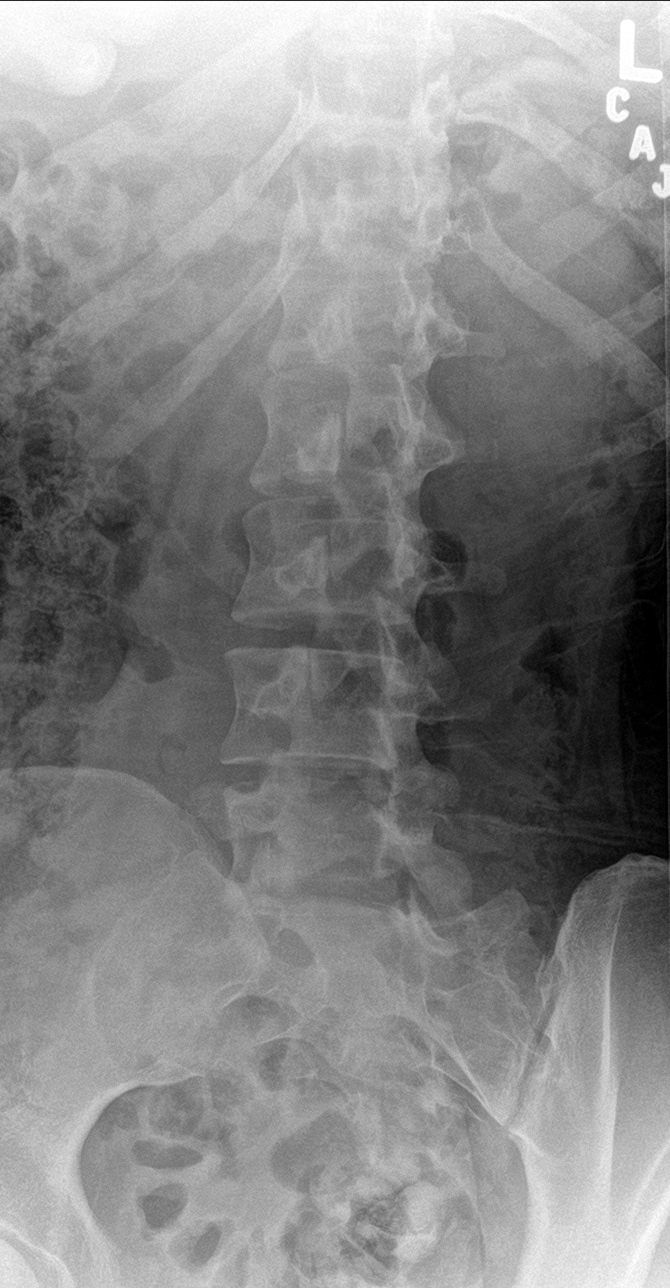

[6 of 6 positions shown; findings below may reference images not displayed]

FINDINGS: There is no evidence of lumbar spine fracture. Alignment is normal.
Intervertebral disc spaces are maintained.
IMPRESSION: Negative.
